# Patient Record
Sex: Female | Born: 1970 | Race: White | Hispanic: No | Smoking: Current every day smoker
Health system: Southern US, Community
[De-identification: ages and names within clinical notes are randomized; demographics above are authoritative.]

## PROBLEM LIST (undated history)

## (undated) DIAGNOSIS — F329 Major depressive disorder, single episode, unspecified: Secondary | ICD-10-CM

## (undated) DIAGNOSIS — IMO0002 Reserved for concepts with insufficient information to code with codable children: Secondary | ICD-10-CM

## (undated) DIAGNOSIS — M1990 Unspecified osteoarthritis, unspecified site: Secondary | ICD-10-CM

## (undated) DIAGNOSIS — M797 Fibromyalgia: Secondary | ICD-10-CM

## (undated) DIAGNOSIS — F32A Depression, unspecified: Secondary | ICD-10-CM

## (undated) DIAGNOSIS — Z6831 Body mass index (BMI) 31.0-31.9, adult: Secondary | ICD-10-CM

## (undated) DIAGNOSIS — E0590 Thyrotoxicosis, unspecified without thyrotoxic crisis or storm: Secondary | ICD-10-CM

## (undated) DIAGNOSIS — G8929 Other chronic pain: Secondary | ICD-10-CM

## (undated) HISTORY — PX: OOPHORECTOMY: SHX86

## (undated) HISTORY — PX: ABDOMINAL HYSTERECTOMY: SHX81

## (undated) HISTORY — DX: Other chronic pain: G89.29

## (undated) HISTORY — DX: Body mass index (BMI) 31.0-31.9, adult: Z68.31

## (undated) HISTORY — PX: CHOLECYSTECTOMY: SHX55

## (undated) HISTORY — DX: Thyrotoxicosis, unspecified without thyrotoxic crisis or storm: E05.90

---

## 2010-09-11 ENCOUNTER — Encounter: Admission: RE | Admit: 2010-09-11 | Discharge: 2010-09-11 | Payer: Self-pay | Admitting: Family Medicine

## 2010-11-17 ENCOUNTER — Encounter: Payer: Self-pay | Admitting: Family Medicine

## 2013-07-15 ENCOUNTER — Encounter (HOSPITAL_COMMUNITY): Payer: Self-pay | Admitting: *Deleted

## 2013-07-15 ENCOUNTER — Emergency Department (HOSPITAL_COMMUNITY)
Admission: EM | Admit: 2013-07-15 | Discharge: 2013-07-15 | Disposition: A | Discharge status: Home / Self Care | Payer: BC Managed Care – PPO | Attending: Emergency Medicine | Admitting: Emergency Medicine

## 2013-07-15 ENCOUNTER — Emergency Department (HOSPITAL_COMMUNITY): Payer: BC Managed Care – PPO

## 2013-07-15 DIAGNOSIS — Y9389 Activity, other specified: Secondary | ICD-10-CM | POA: Insufficient documentation

## 2013-07-15 DIAGNOSIS — W3400XA Accidental discharge from unspecified firearms or gun, initial encounter: Secondary | ICD-10-CM

## 2013-07-15 DIAGNOSIS — W320XXA Accidental handgun discharge, initial encounter: Secondary | ICD-10-CM | POA: Insufficient documentation

## 2013-07-15 DIAGNOSIS — Y92009 Unspecified place in unspecified non-institutional (private) residence as the place of occurrence of the external cause: Secondary | ICD-10-CM | POA: Insufficient documentation

## 2013-07-15 DIAGNOSIS — S81009A Unspecified open wound, unspecified knee, initial encounter: Secondary | ICD-10-CM | POA: Insufficient documentation

## 2013-07-15 DIAGNOSIS — Z23 Encounter for immunization: Secondary | ICD-10-CM | POA: Insufficient documentation

## 2013-07-15 HISTORY — DX: Reserved for concepts with insufficient information to code with codable children: IMO0002

## 2013-07-15 HISTORY — DX: Fibromyalgia: M79.7

## 2013-07-15 HISTORY — DX: Major depressive disorder, single episode, unspecified: F32.9

## 2013-07-15 HISTORY — DX: Unspecified osteoarthritis, unspecified site: M19.90

## 2013-07-15 HISTORY — DX: Depression, unspecified: F32.A

## 2013-07-15 MED ORDER — MORPHINE SULFATE 4 MG/ML IJ SOLN: 4.0000 mg | Freq: Once | INTRAMUSCULAR | Status: AC

## 2013-07-15 MED ORDER — OXYCODONE-ACETAMINOPHEN 5-325 MG PO TABS: 1.0000 | ORAL_TABLET | ORAL | Status: DC | PRN

## 2013-07-15 MED ORDER — MORPHINE SULFATE 2 MG/ML IJ SOLN
INTRAMUSCULAR | Status: AC
  Administered 2013-07-15: 4 mg via INTRAVENOUS
  Filled 2013-07-15: qty 2

## 2013-07-15 MED ORDER — TETANUS-DIPHTH-ACELL PERTUSSIS 5-2.5-18.5 LF-MCG/0.5 IM SUSP
INTRAMUSCULAR | Status: AC
  Filled 2013-07-15: qty 0.5

## 2013-07-15 MED ORDER — TETANUS-DIPHTH-ACELL PERTUSSIS 5-2.5-18.5 LF-MCG/0.5 IM SUSP
0.5000 mL | Freq: Once | INTRAMUSCULAR | Status: AC
  Administered 2013-07-15: 0.5 mL via INTRAMUSCULAR
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
## 2013-07-15 NOTE — ED Notes (Signed)
Areas cleaned with sterile water and dressing applied.  Crutches given.
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
## 2018-09-09 ENCOUNTER — Encounter: Payer: Self-pay | Admitting: Family Medicine

## 2018-09-15 ENCOUNTER — Encounter: Payer: Self-pay | Admitting: Cardiology

## 2018-09-16 ENCOUNTER — Encounter: Payer: Self-pay | Admitting: Cardiology

## 2018-09-16 ENCOUNTER — Ambulatory Visit (INDEPENDENT_AMBULATORY_CARE_PROVIDER_SITE_OTHER): Payer: Medicare Other | Admitting: Cardiology

## 2018-09-16 VITALS — BP 116/72 | HR 116 | Ht 63.0 in | Wt 175.0 lb

## 2018-09-16 DIAGNOSIS — R002 Palpitations: Secondary | ICD-10-CM | POA: Insufficient documentation

## 2018-09-16 DIAGNOSIS — R000 Tachycardia, unspecified: Secondary | ICD-10-CM

## 2018-09-16 DIAGNOSIS — F17210 Nicotine dependence, cigarettes, uncomplicated: Secondary | ICD-10-CM | POA: Diagnosis not present

## 2018-09-16 DIAGNOSIS — R0789 Other chest pain: Secondary | ICD-10-CM

## 2018-09-16 MED ORDER — VERAPAMIL HCL ER 100 MG PO CP24: 100.0000 mg | ORAL_CAPSULE | Freq: Every day | ORAL | 0 refills | Status: DC

## 2018-09-16 NOTE — Patient Instructions (Signed)
Medication Instructions:  Your physician has recommended you make the following change in your medication:   START verapamil 100 mg daily  If you need a refill on your cardiac medications before your next appointment, please call your pharmacy.   Lab work: None  If you have labs (blood work) drawn today and your tests are completely normal, you will receive your results only by: Marland Kitchen. MyChart Message (if you have MyChart) OR . A paper copy in the mail If you have any lab test that is abnormal or we need to change your treatment, we will call you to review the results.  Testing/Procedures: Your physician has requested that you have an echocardiogram. Echocardiography is a painless test that uses sound waves to create images of your heart. It provides your doctor with information about the size and shape of your heart and how well your heart's chambers and valves are working. This procedure takes approximately one hour. There are no restrictions for this procedure.  Your physician has recommended that you wear a holter monitor. Holter monitors are medical devices that record the heart's electrical activity. Doctors most often use these monitors to diagnose arrhythmias. Arrhythmias are problems with the speed or rhythm of the heartbeat. The monitor is a small, portable device. You can wear one while you do your normal daily activities. This is usually used to diagnose what is causing palpitations/syncope (passing out).  Your physician has requested that you have a lexiscan myoview. For further information please visit [email or web address]. Please follow instruction sheet, as given.  Follow-Up: At [facility], you and your health needs are our priority.  As part of our continuing mission to provide you with exceptional heart care, we have created designated Provider Care Teams.  These Care Teams include your primary Cardiologist (physician) and Advanced Practice Providers (APPs -  Physician  Assistants and Nurse Practitioners) who all work together to provide you with the care you need, when you need it.  You will need a follow up appointment in 3 months.  Please call our office 2 months in advance to schedule this appointment.  You may see another member of our [facility] Provider Team in [place]: Gypsy Balsamobert Krasowski, MD . Norman HerrlichBrian Munley, MD  Any Other Special Instructions Will Be Listed Below (If Applicable).  Please be sure to increase your water and salt intake slightly per the recommendation of Dr. Tomie Chinaevankar.  Please be sure to not take the verapamil the first 24 hours of holter monitoring, you may resume after.

## 2018-09-16 NOTE — Progress Notes (Signed)
Cardiology Office Note:    Date:  09/16/2018   ID:  Stacey Rivas, DOB 01/05/1971, MRN [id number]  PCP:  Wenda LowPotts, Anna, NP  Cardiologist:  Garwin Brothersajan R Emali Heyward, MD   Referring MD: Wenda LowPotts, Anna, NP    ASSESSMENT:    1. Palpitations   2. Cigarette smoker   3. Chest discomfort    PLAN:    In order of problems listed above:  1. Primary prevention stressed with the patient.  Importance of compliance with diet and medication stressed and she vocalized understanding.  Her blood pressure is stable. 2. I am awaiting blood work report from her primary care physician.  We will do a 48-hour Holter monitor to understand her symptom profile and heart rate and rhythm. 3. Echocardiogram will be done to assess murmur on auscultation.  In view of her risk factors she will undergo Lexiscan sestamibi. 4. I will initiate her on long-acting verapamil and keep a track of her heart rates. 5. Patient will be seen in follow-up appointment in 3 months or earlier if the patient has any concerns    Medication Adjustments/Labs and Tests Ordered: Current medicines are reviewed at length with the patient today.  Concerns regarding medicines are outlined above.  No orders of the defined types were placed in this encounter.  No orders of the defined types were placed in this encounter.    History of Present Illness:    Stacey Rivas is a 47 y.o. female who is being seen today for the evaluation of palpitations and chest discomfort at the request of Wenda LowPotts, Anna, NP.  Patient is a pleasant 47 year old female.  She has past medical history of chronic disabling arthritis for which she is on disability and leads a sedentary lifestyle.  She mentions to me that over the past several months she has noticed her heart rate to be on the faster side.  She occasionally has chest discomfort but this is not related to exertion.  Because of her orthopedic issues she generally stays at home and does not ambulate much.  No orthopnea  PND or any syncope.  At the time of my evaluation, the patient is alert awake oriented and in no distress.  Past Medical History:  Diagnosis Date  . Body mass index (BMI) of 31.0 to 31.9 in adult   . Chronic pain   . Degenerative disc disease   . Depression   . Fibromyalgia syndrome   . Hyperthyroidism   . Osteoarthritis     Past Surgical History:  Procedure Laterality Date  . ABDOMINAL HYSTERECTOMY    . CHOLECYSTECTOMY     Laparoscopic  . OOPHORECTOMY      Current Medications: Current Meds  Medication Sig  . amitriptyline (ELAVIL) 25 MG tablet Take 50 mg by mouth at bedtime.  . budesonide-formoterol (SYMBICORT) 160-4.5 MCG/ACT inhaler Inhale 1 puff into the lungs every evening.  . gabapentin (NEURONTIN) 800 MG tablet Take 800 mg by mouth 3 (three) times daily as needed.  . meloxicam (MOBIC) 15 MG tablet Take 15 mg by mouth daily.  Marland Kitchen. morphine (MSIR) 30 MG tablet Take 60 mg by mouth 3 (three) times daily as needed.  . naloxegol oxalate (MOVANTIK) 25 MG TABS tablet Take 1 tablet by mouth daily.  Marland Kitchen. omeprazole (PRILOSEC) 40 MG capsule Take 40 mg by mouth 2 (two) times daily.  . OXYCODONE HCL ER PO Take 25 mg by mouth every 12 (twelve) hours.  Marland Kitchen. tiZANidine (ZANAFLEX) 4 MG capsule Take 4 mg by  mouth 2 (two) times daily as needed.     Allergies:   Tape   Social History   Socioeconomic History  . Marital status: Unknown    Spouse name: Not on file  . Number of children: Not on file  . Years of education: Not on file  . Highest education level: Not on file  Occupational History  . Not on file  Social Needs  . Financial resource strain: Not on file  . Food insecurity:    Worry: Not on file    Inability: Not on file  . Transportation needs:    Medical: Not on file    Non-medical: Not on file  Tobacco Use  . Smoking status: Current Every Day Smoker    Packs/day: 1.00    Types: Cigarettes  . Smokeless tobacco: Never Used  Substance and Sexual Activity  . Alcohol use:  No  . Drug use: No  . Sexual activity: Yes  Lifestyle  . Physical activity:    Days per week: Not on file    Minutes per session: Not on file  . Stress: Not on file  Relationships  . Social connections:    Talks on phone: Not on file    Gets together: Not on file    Attends religious service: Not on file    Active member of club or organization: Not on file    Attends meetings of clubs or organizations: Not on file    Relationship status: Not on file  Other Topics Concern  . Not on file  Social History Narrative  . Not on file     Family History: The patient's family history includes Breast cancer in her paternal grandmother; Diabetes in her mother; Heart disease in her paternal grandmother; Hypertension in her maternal grandfather and paternal grandmother; Stroke in her paternal grandmother; Thyroid disease in her maternal grandmother and paternal grandmother.  ROS:   Please see the history of present illness.    All other systems reviewed and are negative.  EKGs/Labs/Other Studies Reviewed:    The following studies were reviewed today: I discussed my findings with the patient at extensive length.  EKG reveals sinus rhythm and nonspecific ST-T changes and tachycardia.    Recent Labs: No results found for requested labs within last 8760 hours.  Recent Lipid Panel No results found for: CHOL, TRIG, HDL, CHOLHDL, VLDL, LDLCALC, LDLDIRECT  Physical Exam:    VS:  BP 116/72 (BP Location: Right Arm, Patient Position: Sitting, Cuff Size: Normal)   Pulse (!) 116   Ht 5\' 3"  (1.6 m)   Wt 175 lb (79.4 kg)   SpO2 98%   BMI 31.00 kg/m     Wt Readings from Last 3 Encounters:  09/16/18 175 lb (79.4 kg)  07/15/13 154 lb (69.9 kg)     GEN: Patient is in no acute distress HEENT: Normal NECK: No JVD; No carotid bruits LYMPHATICS: No lymphadenopathy CARDIAC: S1 S2 regular, 2/6 systolic murmur at the apex. RESPIRATORY:  Clear to auscultation without rales, wheezing or rhonchi    ABDOMEN: Soft, non-tender, non-distended MUSCULOSKELETAL:  No edema; No deformity  SKIN: Warm and dry NEUROLOGIC:  Alert and oriented x 3 PSYCHIATRIC:  Normal affect    Signed, Garwin Brothers, MD  09/16/2018 3:28 PM    [facility]

## 2018-09-28 ENCOUNTER — Ambulatory Visit (INDEPENDENT_AMBULATORY_CARE_PROVIDER_SITE_OTHER): Payer: Medicare Other

## 2018-09-28 DIAGNOSIS — R0789 Other chest pain: Secondary | ICD-10-CM | POA: Diagnosis not present

## 2018-09-28 DIAGNOSIS — R002 Palpitations: Secondary | ICD-10-CM | POA: Diagnosis not present

## 2018-09-28 DIAGNOSIS — R000 Tachycardia, unspecified: Secondary | ICD-10-CM

## 2018-10-02 ENCOUNTER — Telehealth: Payer: Self-pay | Admitting: Emergency Medicine

## 2018-10-02 NOTE — Telephone Encounter (Signed)
Left message for patient to return call regarding medication changes. 

## 2018-10-06 MED ORDER — METOPROLOL SUCCINATE ER 25 MG PO TB24: 25.0000 mg | ORAL_TABLET | Freq: Every day | ORAL | 1 refills | Status: DC

## 2018-10-06 NOTE — Telephone Encounter (Signed)
Follow up   Received after hours message patient is returning call in reference to medication.

## 2018-10-06 NOTE — Telephone Encounter (Signed)
Patient informed to start metoprolol succinate 25 mg daily per Dr. Tomie Chinaevankar she verbally understands

## 2018-10-06 NOTE — Addendum Note (Signed)
Addended by: Vanessa DurhamBOWMAN, Maxamillion Banas R on: 10/06/2018 11:05 AM   Modules accepted: Orders

## 2018-10-09 ENCOUNTER — Telehealth: Payer: Self-pay

## 2018-10-09 NOTE — Telephone Encounter (Signed)
Called patient to discuss results and patient states she has been checking her BP and it was high for a couple of days and checked it today and it was 134/98 and heart rate was 86 she just wants to make sure she doesn't need change in therapy please advise.

## 2018-10-09 NOTE — Telephone Encounter (Signed)
-----   Message from Garwin Brothersajan R Revankar, MD sent at 10/08/2018  5:44 PM EST ----- The results of the study is unremarkable. Please inform patient. I will discuss in detail at next appointment. Cc  primary care/referring physician Garwin Brothersajan R Revankar, MD 10/08/2018 5:44 PM
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
## 2018-10-09 NOTE — Telephone Encounter (Signed)
Patient called and notified of test results. 

## 2018-10-09 NOTE — Telephone Encounter (Signed)
Called and discussed with patient task complete.

## 2018-10-26 ENCOUNTER — Other Ambulatory Visit: Payer: Self-pay | Admitting: Cardiology

## 2018-11-05 ENCOUNTER — Ambulatory Visit (INDEPENDENT_AMBULATORY_CARE_PROVIDER_SITE_OTHER): Payer: Medicare Other

## 2018-11-05 DIAGNOSIS — R0789 Other chest pain: Secondary | ICD-10-CM | POA: Diagnosis not present

## 2018-11-05 DIAGNOSIS — R002 Palpitations: Secondary | ICD-10-CM

## 2018-11-05 DIAGNOSIS — R000 Tachycardia, unspecified: Secondary | ICD-10-CM

## 2018-11-05 NOTE — Progress Notes (Signed)
Complete echocardiogram has been performed.  Jimmy Priyansh Pry RDCS, RVT 

## 2018-11-19 ENCOUNTER — Telehealth (HOSPITAL_COMMUNITY): Payer: Self-pay | Admitting: *Deleted

## 2018-11-19 NOTE — Telephone Encounter (Signed)
Patient given detailed instructions per Myocardial Perfusion Study Information Sheet for the test on 11/24/18. Patient notified to arrive 15 minutes early and that it is imperative to arrive on time for appointment to keep from having the test rescheduled.  If you need to cancel or reschedule your appointment, please call the office within 24 hours of your appointment. . Patient verbalized understanding. Stacey Rivas    

## 2018-11-24 ENCOUNTER — Ambulatory Visit (INDEPENDENT_AMBULATORY_CARE_PROVIDER_SITE_OTHER): Payer: Medicare Other

## 2018-11-24 VITALS — Ht 63.0 in | Wt 175.0 lb

## 2018-11-24 DIAGNOSIS — R002 Palpitations: Secondary | ICD-10-CM | POA: Diagnosis not present

## 2018-11-24 DIAGNOSIS — R000 Tachycardia, unspecified: Secondary | ICD-10-CM

## 2018-11-24 DIAGNOSIS — R0789 Other chest pain: Secondary | ICD-10-CM

## 2018-11-24 LAB — MYOCARDIAL PERFUSION IMAGING
LVDIAVOL: 57 mL (ref 46–106)
LVSYSVOL: 15 mL
Peak HR: 116 {beats}/min
Rest HR: 91 {beats}/min
SDS: 0
SRS: 4
SSS: 4
TID: 1.1

## 2018-11-24 MED ORDER — TECHNETIUM TC 99M TETROFOSMIN IV KIT
32.5000 | PACK | Freq: Once | INTRAVENOUS | Status: AC | PRN
  Administered 2018-11-24: 32.5 via INTRAVENOUS

## 2018-11-24 MED ORDER — TECHNETIUM TC 99M TETROFOSMIN IV KIT
9.5000 | PACK | Freq: Once | INTRAVENOUS | Status: AC | PRN
  Administered 2018-11-24: 9.5 via INTRAVENOUS

## 2018-11-24 MED ORDER — REGADENOSON 0.4 MG/5ML IV SOLN
0.4000 mg | Freq: Once | INTRAVENOUS | Status: AC
  Administered 2018-11-24: 0.4 mg via INTRAVENOUS

## 2018-12-18 ENCOUNTER — Ambulatory Visit: Payer: Self-pay | Admitting: Cardiology

## 2018-12-29 ENCOUNTER — Other Ambulatory Visit: Payer: Self-pay | Admitting: Cardiology

## 2019-03-18 ENCOUNTER — Other Ambulatory Visit: Payer: Self-pay | Admitting: Cardiology

## 2019-04-02 ENCOUNTER — Other Ambulatory Visit: Payer: Self-pay | Admitting: Cardiology

## 2019-05-06 ENCOUNTER — Other Ambulatory Visit: Payer: Self-pay | Admitting: Cardiology

## 2019-05-17 ENCOUNTER — Other Ambulatory Visit: Payer: Self-pay | Admitting: Cardiology

## 2019-06-02 ENCOUNTER — Other Ambulatory Visit: Payer: Self-pay | Admitting: Cardiology

## 2019-06-02 NOTE — Telephone Encounter (Signed)
15 day Refill sent to [facility], overdue for appt.

## 2019-06-14 ENCOUNTER — Other Ambulatory Visit: Payer: Self-pay | Admitting: Cardiology

## 2019-06-23 ENCOUNTER — Other Ambulatory Visit: Payer: Self-pay | Admitting: Cardiology

## 2019-07-07 ENCOUNTER — Other Ambulatory Visit: Payer: Self-pay | Admitting: Cardiology

## 2019-07-15 ENCOUNTER — Other Ambulatory Visit: Payer: Self-pay | Admitting: Cardiology

## 2019-08-24 ENCOUNTER — Other Ambulatory Visit: Payer: Self-pay | Admitting: Cardiology

## 2019-09-07 ENCOUNTER — Other Ambulatory Visit: Payer: Self-pay | Admitting: Cardiology

## 2019-09-21 ENCOUNTER — Other Ambulatory Visit: Payer: Self-pay | Admitting: Cardiology

## 2019-09-29 ENCOUNTER — Other Ambulatory Visit: Payer: Self-pay | Admitting: Cardiology

## 2019-10-07 ENCOUNTER — Other Ambulatory Visit: Payer: Self-pay | Admitting: Cardiology

## 2019-11-08 ENCOUNTER — Other Ambulatory Visit: Payer: Self-pay | Admitting: Cardiology
